# Patient Record
Sex: Male | Born: 2000 | Race: White | Hispanic: No | Marital: Single | Smoking: Never smoker
Health system: Southern US, Community
[De-identification: ages and names within clinical notes are randomized; demographics above are authoritative.]

## PROBLEM LIST (undated history)

## (undated) DIAGNOSIS — K5909 Other constipation: Secondary | ICD-10-CM

## (undated) DIAGNOSIS — G40909 Epilepsy, unspecified, not intractable, without status epilepticus: Secondary | ICD-10-CM

## (undated) DIAGNOSIS — Q9389 Other deletions from the autosomes: Secondary | ICD-10-CM

## (undated) DIAGNOSIS — F71 Moderate intellectual disabilities: Secondary | ICD-10-CM

## (undated) DIAGNOSIS — F840 Autistic disorder: Secondary | ICD-10-CM

## (undated) DIAGNOSIS — F6381 Intermittent explosive disorder: Secondary | ICD-10-CM

## (undated) DIAGNOSIS — G809 Cerebral palsy, unspecified: Secondary | ICD-10-CM

## (undated) HISTORY — PX: NO PAST SURGERIES: SHX2092

---
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
## 2018-01-19 ENCOUNTER — Ambulatory Visit (INDEPENDENT_AMBULATORY_CARE_PROVIDER_SITE_OTHER): Payer: Medicaid Other | Admitting: Neurology

## 2018-01-19 ENCOUNTER — Encounter: Payer: Self-pay | Admitting: Neurology

## 2018-01-19 VITALS — BP 120/76 | HR 86 | Ht 65.0 in | Wt 151.0 lb

## 2018-01-19 DIAGNOSIS — G40409 Other generalized epilepsy and epileptic syndromes, not intractable, without status epilepticus: Secondary | ICD-10-CM | POA: Diagnosis not present

## 2018-01-19 DIAGNOSIS — R296 Repeated falls: Secondary | ICD-10-CM
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
## 2018-01-19 NOTE — Patient Instructions (Addendum)
1. Have bloodwork for Lamotrigine level done early in the morning BEFORE he takes his morning dose of Lamotrigine. We will adjust dose depending on this level  Your provider has requested that you have LABS drawn in the FUTURE.  Our laboratory is located within [facility], [phone] of this building.  You do not need an appointment however, please come to [facility] prior to going to the lab - we will check you into the lab.     2. We will get an MRI brain with and without contrast under sedation scheduled 3. Schedule routine EEG 4. Keep a calendar of the trance episodes  5. Follow-up in 3-4 months, call for any changes

## 2018-02-04 ENCOUNTER — Other Ambulatory Visit: Payer: Medicaid Other

## 2018-02-04 DIAGNOSIS — G40409 Other generalized epilepsy and epileptic syndromes, not intractable, without status epilepticus: Secondary | ICD-10-CM

## 2018-02-04 DIAGNOSIS — R296 Repeated falls: Secondary | ICD-10-CM

## 2018-02-08 LAB — LAMOTRIGINE LEVEL: Lamotrigine Lvl: 9.1 ug/mL (ref 4.0–18.0)

## 2018-02-09 ENCOUNTER — Other Ambulatory Visit: Payer: Self-pay

## 2018-02-09 ENCOUNTER — Telehealth: Payer: Self-pay

## 2018-02-09 MED ORDER — LAMOTRIGINE 150 MG PO TABS: ORAL_TABLET | ORAL | 11 refills | Status: AC

## 2018-02-09 NOTE — Telephone Encounter (Signed)
Thanks! Let me talk to Darl Pikes first and see if she can try doing an EEG here. Hospital techs have not done EEG under sedation with MRI in the past and may be hard to coordinate.

## 2018-02-09 NOTE — Telephone Encounter (Signed)
LMOM relaying message below.    Dr. Karel Jarvis, I contacted scheduling at [facility] - pt is scheduled for MRI on Thursday June 6th

## 2018-02-09 NOTE — Telephone Encounter (Signed)
-----   Message from Van Clines, MD sent at 02/09/2018  9:56 AM EDT ----- Pls let his caregiver [name] know there is room to increase the Lamictal and see if he is having less spells on higher dose. He is currently on  BID, increase to  in AM, 450 mg in PM (he is taking  tabs, 2 in AM, 3 in PM). Also, can you pls f/u on MRI brain under sedation? When we get a date, I will ask the EEG techs in the hospital if they can potentially do an EEG at the same time. Thanks

## 2018-02-16 ENCOUNTER — Ambulatory Visit: Payer: Medicaid Other | Admitting: Neurology

## 2018-02-16 ENCOUNTER — Encounter

## 2018-02-24 ENCOUNTER — Ambulatory Visit (INDEPENDENT_AMBULATORY_CARE_PROVIDER_SITE_OTHER): Payer: Medicaid Other | Admitting: Neurology

## 2018-02-24 DIAGNOSIS — G40409 Other generalized epilepsy and epileptic syndromes, not intractable, without status epilepticus: Secondary | ICD-10-CM | POA: Diagnosis not present

## 2018-02-24 DIAGNOSIS — R296 Repeated falls: Secondary | ICD-10-CM

## 2018-03-02 ENCOUNTER — Other Ambulatory Visit: Payer: Self-pay

## 2018-03-02 ENCOUNTER — Encounter (HOSPITAL_COMMUNITY): Payer: Self-pay | Admitting: *Deleted

## 2018-03-02 NOTE — Pre-Procedure Instructions (Signed)
    [name]  03/02/2018      [facility] [place], [place] - [address] [place] [address] Phone: [phone] Fax: [phone]  [facility] [phone] - [place], [place] - [id number] [facility] [phone] [place] [id number] Phone: [phone] Fax: [phone]    Your procedure is scheduled on Thursday, March 03, 2018  [facility] at 5:30 A.M.  Call this number if you have problems the morning of surgery:  [phone]   Remember:  Do not eat food or drink liquids after midnight .  Take these medicines the morning of surgery with A SIP OF WATER: chlorproMAZINE (THORAZINE), hydrOXYzine (ATARAX/VISTARIL), lamoTRIgine (LAMICTAL), propranolol (INDERAL)   Stop taking vitamnins and herbal medications.   Do not wear jewelry, make-up or nail polish.  Do not wear lotions, powders, or perfumes, or deodorant.  Do not shave 48 hours prior to surgery.  Men may shave face and neck.  Do not bring valuables to the hospital.  [facility] is not responsible for any belongings or valuables.  Contacts, dentures or bridgework may not be worn into surgery.  For patients admitted to the hospital, discharge time will be determined by your treatment team. Patients discharged the day of surgery will not be allowed to drive home. Please read over the following fact sheets that you were given.

## 2018-03-02 NOTE — Progress Notes (Signed)
SDW-Pre-op call completed by Judie GrieveBryan, pt caregiver at [facility]. Caregiver denies that pt has a cardiac history. Caregiver denies that pt C/O SOB and chest pain. Caregiver denies that pt had a stress test, echo and cardiac cath " not since I have been taking care of him in the last two years." Caregiver denies that pt had an EKG and chest x ray within the last year. Caregiver denies recent labs. Caregiver made aware that pt must stop taking vitamins and herbal medications. Caregiver stated that pt can swallow medications whole without food. Caregiver verbalized understanding of all pre-op instructions.

## 2018-03-03 ENCOUNTER — Ambulatory Visit (HOSPITAL_COMMUNITY)
Admission: RE | Admit: 2018-03-03 | Discharge: 2018-03-03 | Disposition: A | Discharge status: Home / Self Care | Payer: Medicaid Other | Source: Ambulatory Visit | Attending: Neurology | Admitting: Neurology

## 2018-03-03 ENCOUNTER — Encounter (HOSPITAL_COMMUNITY): Payer: Self-pay

## 2018-03-03 ENCOUNTER — Ambulatory Visit (HOSPITAL_COMMUNITY): Payer: Medicaid Other | Admitting: Certified Registered Nurse Anesthetist

## 2018-03-03 ENCOUNTER — Ambulatory Visit (HOSPITAL_COMMUNITY): Payer: Medicaid Other

## 2018-03-03 ENCOUNTER — Encounter (HOSPITAL_COMMUNITY): Admission: RE | Disposition: A | Discharge status: Home / Self Care | Payer: Self-pay | Source: Ambulatory Visit

## 2018-03-03 DIAGNOSIS — F840 Autistic disorder: Secondary | ICD-10-CM | POA: Insufficient documentation

## 2018-03-03 DIAGNOSIS — R0902 Hypoxemia: Secondary | ICD-10-CM

## 2018-03-03 DIAGNOSIS — G40909 Epilepsy, unspecified, not intractable, without status epilepticus: Secondary | ICD-10-CM | POA: Diagnosis present

## 2018-03-03 DIAGNOSIS — Q9389 Other deletions from the autosomes: Secondary | ICD-10-CM | POA: Insufficient documentation

## 2018-03-03 DIAGNOSIS — R6250 Unspecified lack of expected normal physiological development in childhood: Secondary | ICD-10-CM | POA: Insufficient documentation

## 2018-03-03 DIAGNOSIS — R296 Repeated falls: Secondary | ICD-10-CM

## 2018-03-03 DIAGNOSIS — G40409 Other generalized epilepsy and epileptic syndromes, not intractable, without status epilepticus: Secondary | ICD-10-CM

## 2018-03-03 DIAGNOSIS — R0602 Shortness of breath: Secondary | ICD-10-CM

## 2018-03-03 DIAGNOSIS — F6381 Intermittent explosive disorder: Secondary | ICD-10-CM | POA: Diagnosis not present

## 2018-03-03 HISTORY — DX: Cerebral palsy, unspecified: G80.9

## 2018-03-03 HISTORY — DX: Moderate intellectual disabilities: F71

## 2018-03-03 HISTORY — PX: RADIOLOGY WITH ANESTHESIA: SHX6223

## 2018-03-03 HISTORY — DX: Other constipation: K59.09

## 2018-03-03 HISTORY — DX: Autistic disorder: F84.0

## 2018-03-03 LAB — CBC
HCT: 38.5 % — ABNORMAL LOW (ref 39.0–52.0)
Hemoglobin: 12.3 g/dL — ABNORMAL LOW (ref 13.0–17.0)
MCH: 27.4 pg (ref 26.0–34.0)
MCHC: 31.9 g/dL (ref 30.0–36.0)
MCV: 85.7 fL (ref 78.0–100.0)
Platelets: 235 10*3/uL (ref 150–400)
RBC: 4.49 MIL/uL (ref 4.22–5.81)
RDW: 13.5 % (ref 11.5–15.5)
WBC: 10.2 10*3/uL (ref 4.0–10.5)

## 2018-03-03 LAB — BASIC METABOLIC PANEL
Anion gap: 11 (ref 5–15)
BUN: 11 mg/dL (ref 6–20)
CALCIUM: 9.4 mg/dL (ref 8.9–10.3)
CO2: 23 mmol/L (ref 22–32)
CREATININE: 0.7 mg/dL (ref 0.61–1.24)
Chloride: 107 mmol/L (ref 101–111)
GFR calc non Af Amer: >60 mL/min (ref >60)
Glucose, Bld: 76 mg/dL (ref 65–99)
Potassium: 4.6 mmol/L (ref 3.5–5.1)
SODIUM: 141 mmol/L (ref 135–145)

## 2018-03-03 SURGERY — MRI WITH ANESTHESIA
Anesthesia: General

## 2018-03-03 MED ORDER — FENTANYL CITRATE (PF) 100 MCG/2ML IJ SOLN: 25.0000 ug | INTRAMUSCULAR | Status: DC | PRN

## 2018-03-03 MED ORDER — MEPERIDINE HCL 50 MG/ML IJ SOLN: 6.2500 mg | INTRAMUSCULAR | Status: DC | PRN

## 2018-03-03 MED ORDER — GADOBENATE DIMEGLUMINE 529 MG/ML IV SOLN
15.0000 mL | Freq: Once | INTRAVENOUS | Status: AC
  Administered 2018-03-03: 15 mL via INTRAVENOUS

## 2018-03-03 MED ORDER — PROMETHAZINE HCL 25 MG/ML IJ SOLN: 6.2500 mg | INTRAMUSCULAR | Status: DC | PRN

## 2018-03-03 NOTE — Transfer of Care (Signed)
Immediate Anesthesia Transfer of Care Note  Patient: Kevin Strickland  Procedure(s) Performed: MRI OF THE BRAIN WITH AND WITHOUT CONTRAST (N/A )  Patient Location: PACU  Anesthesia Type:General  Level of Consciousness: awake, alert  and patient cooperative  Airway & Oxygen Therapy: Patient Spontanous Breathing  Post-op Assessment: Report given to RN and Post -op Vital signs reviewed and stable  Post vital signs: Reviewed  Last Vitals:  Vitals Value Taken Time  BP    Temp    Pulse    Resp    SpO2      Last Pain:  Vitals:   03/03/18 1110  TempSrc:   PainSc: 0-No pain      Patients Stated Pain Goal: 0 (03/03/18 [id number])  Complications: No apparent anesthesia complications

## 2018-03-03 NOTE — Addendum Note (Signed)
Addendum  created 03/03/18 1242 by Rosalio MacadamiaHayes, Cherlyn Syring T, CRNA   Charge Capture section accepted

## 2018-03-03 NOTE — Addendum Note (Signed)
Addendum  created 03/03/18 1325 by Lewie LoronGermeroth, Joandy Burget, MD   Sign clinical note

## 2018-03-03 NOTE — Anesthesia Postprocedure Evaluation (Signed)
Anesthesia Post Note  Patient: Kevin Strickland  Procedure(s) Performed: MRI OF THE BRAIN WITH AND WITHOUT CONTRAST (N/A )     Patient location during evaluation: PACU Anesthesia Type: General Level of consciousness: sedated and patient cooperative Pain management: pain level controlled Vital Signs Assessment: post-procedure vital signs reviewed and stable Respiratory status: spontaneous breathing Cardiovascular status: stable Anesthetic complications: no    Last Vitals:  Vitals:   03/03/18 1110 03/03/18 1125  BP: (!) 113/99 118/87  Pulse: 88 86  Resp: 16 (!) 8  Temp:    SpO2: 91% (!) 87%    Last Pain:  Vitals:   03/03/18 1110  TempSrc:   PainSc: 0-No pain                 Lewie LoronJohn Jerrie Gullo

## 2018-03-03 NOTE — Anesthesia Preprocedure Evaluation (Addendum)
Anesthesia Evaluation  Patient identified by MRN, date of birth, ID band Patient awake    Reviewed: Allergy & Precautions, NPO status , Patient's Chart, lab work & pertinent test results  Airway Mallampati: I  TM Distance: >3 FB Neck ROM: Full    Dental no notable dental hx.    Pulmonary neg pulmonary ROS,    Pulmonary exam normal breath sounds clear to auscultation       Cardiovascular negative cardio ROS Normal cardiovascular exam Rhythm:Regular Rate:Normal     Neuro/Psych negative neurological ROS  negative psych ROS   GI/Hepatic negative GI ROS, Neg liver ROS,   Endo/Other  negative endocrine ROS  Renal/GU negative Renal ROS     Musculoskeletal negative musculoskeletal ROS (+)   Abdominal   Peds  Hematology negative hematology ROS (+)   Anesthesia Other Findings   Reproductive/Obstetrics                             Anesthesia Physical Anesthesia Plan  ASA: II  Anesthesia Plan: General   Post-op Pain Management:    Induction: Intravenous  PONV Risk Score and Plan: 2 and Ondansetron and Dexamethasone  Airway Management Planned: Oral ETT  Additional Equipment:   Intra-op Plan:   Post-operative Plan: Extubation in OR  Informed Consent: I have reviewed the patients History and Physical, chart, labs and discussed the procedure including the risks, benefits and alternatives for the proposed anesthesia with the patient or authorized representative who has indicated his/her understanding and acceptance.   Dental advisory given  Plan Discussed with: CRNA  Anesthesia Plan Comments:        Anesthesia Quick Evaluation

## 2018-03-03 NOTE — OR Nursing (Signed)
Pt ambulated to restroom with caregiver wtihout difficulty.  Pt remains off O2.  Awaiting chest xray at this time

## 2018-03-03 NOTE — H&P (Signed)
Anesthesia H&P Update: History and Physical Exam reviewed; patient is OK for planned anesthetic and procedure. ? ?

## 2018-03-04 ENCOUNTER — Encounter (HOSPITAL_COMMUNITY): Payer: Self-pay | Admitting: Radiology

## 2018-03-04 MED FILL — Glycopyrrolate IV Soln Prefilled Syr 0.6 MG/3ML (0.2 MG/ML): INTRAVENOUS | Qty: 3 | Status: AC

## 2018-03-04 MED FILL — Neostigmine Methylsulfate Soln Pref Syr 5 MG/5ML (1 MG/ML): INTRAVENOUS | Qty: 5 | Status: AC

## 2018-03-04 MED FILL — Phenylephrine-NaCl Pref Syr 0.4 MG/10ML-0.9% (40 MCG/ML): INTRAVENOUS | Qty: 10 | Status: AC

## 2018-03-04 MED FILL — Lidocaine HCl Local Soln Prefilled Syringe 100 MG/5ML (2%): INTRAMUSCULAR | Qty: 5 | Status: AC

## 2018-03-04 MED FILL — Rocuronium Bromide IV Soln 50 MG/5ML (10 MG/ML): INTRAVENOUS | Qty: 5 | Status: AC

## 2018-03-04 MED FILL — Propofol IV Emul 200 MG/20ML (10 MG/ML): INTRAVENOUS | Qty: 20 | Status: AC

## 2018-03-04 MED FILL — Ondansetron HCl Inj 4 MG/2ML (2 MG/ML): INTRAMUSCULAR | Qty: 2 | Status: AC

## 2018-03-04 MED FILL — Midazolam HCl Inj 2 MG/2ML (Base Equivalent): INTRAMUSCULAR | Qty: 2 | Status: AC

## 2018-03-04 MED FILL — Lactated Ringer's Solution: INTRAVENOUS | Qty: 1000 | Status: AC

## 2018-03-07 NOTE — H&P (Deleted)
Anesthesia H&P Update: History and Physical Exam reviewed; patient is OK for planned anesthetic and procedure. ? ?

## 2018-03-17 NOTE — Procedures (Signed)
ELECTROENCEPHALOGRAM REPORT  Date of Study: 02/24/2018  Patient's Name: Kevin Strickland MRN: [id number] Date of Birth: 11/06/2000  Referring Provider: Dr. 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 Mother is concerned about continued seizures where he "goes in a trance" followed by aggression.  Medications: Lamictal Thorazine Vistaril Inderal Desyrel  Technical Summary: A multichannel digital EEG recording measured by the international 10-20 system with electrodes applied with paste and impedances below 5000 ohms performed in our laboratory with EKG monitoring in an awake patient.  Hyperventilation and photic stimulation were not performed.  The digital EEG was referentially recorded, reformatted, and digitally filtered in a variety of bipolar and referential montages for optimal display.    Description: The patient is awake during the recording. There is no clear posterior dominant rhythm seen. The background shows diffuse low voltage activity with no focal slowing seen. Sleep was not captured. Hyperventilation and photic stimulation were not performed.  There were no epileptiform discharges or electrographic seizures seen.  There was EKG artifact throughout the study.  EKG lead was unremarkable.  Impression: This awake EEG showed diffuse low voltage activity which is nonspecific in etiology, it may be a normal variant (seen with advancing age, nervousness, anxiety) in the absence of extracerebral pathology (scalp edema, subdural hematoma). It can also be seen with degenerative or metabolic conditions. There were no focal abnormalities seen in this study.   Patrcia DollyKaren Aquino, M.D.

## 2018-03-23 ENCOUNTER — Encounter (HOSPITAL_COMMUNITY): Payer: Self-pay

## 2018-03-23 ENCOUNTER — Ambulatory Visit (HOSPITAL_COMMUNITY)
Admission: EM | Admit: 2018-03-23 | Discharge: 2018-03-23 | Disposition: A | Discharge status: Home / Self Care | Payer: Medicaid Other | Attending: Family Medicine | Admitting: Family Medicine

## 2018-03-23 DIAGNOSIS — S80211A Abrasion, right knee, initial encounter: Secondary | ICD-10-CM

## 2018-03-23 DIAGNOSIS — L739 Follicular disorder, unspecified: Secondary | ICD-10-CM

## 2018-03-23 MED ORDER — DOXYCYCLINE HYCLATE 100 MG PO CAPS: 100.0000 mg | ORAL_CAPSULE | Freq: Two times a day (BID) | ORAL | 0 refills | Status: AC

## 2018-03-23 MED ORDER — SELENIUM SULFIDE 2.25 % EX SHAM: 1.0000 "application " | MEDICATED_SHAMPOO | CUTANEOUS | 0 refills | Status: AC

## 2018-03-23 MED ORDER — MUPIROCIN 2 % EX OINT: 1.0000 "application " | TOPICAL_OINTMENT | Freq: Two times a day (BID) | CUTANEOUS | 0 refills | Status: AC

## 2018-03-23 NOTE — ED Provider Notes (Signed)
[facility]    CSN: [id number] Arrival date & time: 03/23/18  1940     History   Chief Complaint No chief complaint on file.   HPI Kevin Strickland is a 17 y.o. male.   17 year old male with history of cerebral palsy, autism, seizure disorder comes in with caregiver for multiple complaints.    Right knee abrasion after fall today. Caregiver states patient was chasing after someone when he tripped and fell.  He was able to ambulate after the incident without problems.  Has been walking without problems.  Has not taken anything for the symptoms.  Few day history of rash to the scalp.  Caregiver states this is a recurrent problem, usually treated by doxycycline.  Denies any changes in hygiene products.  Patient has been scratching scalp, no obvious pain.  Denies fever, chills, night sweats.  Usually uses Hibiclens to clean the scalp.      Past Medical History:  Diagnosis Date  . Autism spectrum disorder   . Chronic constipation   . CP (cerebral palsy) (HCC)   . 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 Intermittent explosive disorder   . Moderate intellectual disability   . Seizure disorder [facility])     Patient Active Problem List   Diagnosis Date Noted  . Adjustment disorder 09/16/2016  . Aggressive behavior     Past Surgical History:  Procedure Laterality Date  . NO PAST SURGERIES    . RADIOLOGY WITH ANESTHESIA N/A 03/03/2018   Procedure: MRI OF THE BRAIN WITH AND WITHOUT CONTRAST;  Surgeon: Radiologist, Medication, MD;  Location: [facility] OR;  Service: Radiology;  Laterality: N/A;       Home Medications    Prior to Admission medications   Medication Sig Start Date End Date Taking? Authorizing Provider  chlorhexidine (HIBICLENS) 4 % external liquid Apply 60 mLs topically 2 (two) times daily.     [provider]  chlorproMAZINE (THORAZINE) 10 MG tablet Take 10 mg by mouth 3 (three) times daily.    [provider]    chlorproMAZINE (THORAZINE) 50 MG tablet Take 50 mg by mouth daily as needed (agitated behavior).     [provider]  doxycycline (VIBRAMYCIN) 100 MG capsule Take 1 capsule (100 mg total) by mouth 2 (two) times daily. 03/23/18   Cathie Hoops, Yenni Carra V, PA-C  hydrOXYzine (ATARAX/VISTARIL) 25 MG tablet Take 25 mg by mouth 4 (four) times daily.     [provider]  lamoTRIgine (LAMICTAL) 150 MG tablet Take 2 tablets in AM and 3 tablets in PM 02/09/18   Van Clines, MD  mupirocin ointment (BACTROBAN) 2 % Apply 1 application topically 2 (two) times daily. 03/23/18   Cathie Hoops, Marchel Foote V, PA-C  neomycin-bacitracin-polymyxin (NEOSPORIN) ointment Apply 1 application topically daily. To scalp    [provider]  propranolol (INDERAL) 40 MG tablet Take 40 mg by mouth 2 (two) times daily.    [provider]  Selenium Sulfide 2.25 % SHAM Apply 1 application topically once a week. 03/23/18   Cathie Hoops, Daevon Holdren V, PA-C  traZODone (DESYREL) 50 MG tablet Take 50 mg by mouth at bedtime.    [provider]    Family History Family History  Problem Relation Age of Onset  . Other Brother        genetic d/o  . Autism Brother     Social History Social History   Tobacco Use  . Smoking status: Never Smoker  . Smokeless tobacco: Never  Used  Substance Use Topics  . Alcohol use: No  . Drug use: Never     Allergies   Depakote [divalproex sodium]   Review of Systems Review of Systems  Reason unable to perform ROS: See HPI as above.     Physical Exam Triage Vital Signs ED Triage Vitals [03/23/18 1954]  Enc Vitals Group     BP 126/75     Pulse Rate 86     Resp 18     Temp      Temp src      SpO2 100 %     Weight      Height      Head Circumference      Peak Flow      Pain Score 4     Pain Loc      Pain Edu?      Excl. in GC?    No data found.  Updated Vital Signs BP 126/75   Pulse 86   Resp 18   SpO2 100%   Physical Exam  Constitutional: He is oriented to person,  place, and time. He appears well-developed and well-nourished. No distress.  HENT:  Head: Normocephalic and atraumatic.  Eyes: Pupils are equal, round, and reactive to light. Conjunctivae are normal.  Musculoskeletal:  Abrasion to the right knee without swelling, erythema, increased warmth.  No contusion seen.  Full range of motion of the knee.  Strength normal and equal bilaterally.  Unable to assess sensation as patient is nonverbal.  Neurological: He is alert and oriented to person, place, and time.  Skin:  See picture below.  Diffuse erythema of the scalp with folliculitis.        UC Treatments / Results  Labs (all labs ordered are listed, but only abnormal results are displayed) Labs Reviewed - No data to display  EKG None  Radiology No results found.  Procedures Procedures (including critical care time)  Medications Ordered in UC Medications - No data to display  Initial Impression / Assessment and Plan / UC Course  I have reviewed the triage vital signs and the nursing notes.  Pertinent labs & imaging results that were available during my care of the patient were reviewed by me and considered in my medical decision making (see chart for details).    Bactroban ointment for abrasion.  Wound care instructions given.  Return precautions given.  Given patient with good improvement on doxycycline the past for folliculitis of the scalp, start doxycycline as directed.  Can try selenium sulfide to see if it can prevent frequent recurrence.  Return precautions given.  Caregiver expresses understanding and agrees to plan.  Final Clinical Impressions(s) / UC Diagnoses   Final diagnoses:  Abrasion of right knee, initial encounter  Folliculitis    ED Prescriptions    Medication Sig Dispense Auth. Provider   Selenium Sulfide 2.25 % SHAM Apply 1 application topically once a week. 180 mL Jaquelyne Firkus V, PA-C   doxycycline (VIBRAMYCIN) 100 MG capsule Take 1 capsule (100 mg total)  by mouth 2 (two) times daily. 20 capsule Cathie HoopsYu, Ritchard Paragas V, PA-C   mupirocin ointment (BACTROBAN) 2 % Apply 1 application topically 2 (two) times daily. 22 g Threasa AlphaYu, Earnestine Tuohey V, PA-C        Martie Fulgham V, [place] JerseyPA-C 03/23/18 2013

## 2018-03-23 NOTE — Discharge Instructions (Signed)
Start Bactroban ointment on the and the abrasion.  Keep area clean and dry, he can clean it with soap and water gently.  Monitor for spreading redness, increased warmth, fever, follow-up for reevaluation.  Start Doxycycline for folliculitis of the scalp.  He can try selenium sulfide  once every 1 to 2 weeks to try to prevent recurrence.  Follow-up with PCP for further evaluation if symptoms recur.

## 2018-03-23 NOTE — ED Triage Notes (Signed)
Pt has abrasion to right knee from fall on to cement.  And also rash that is recurrent on his scalp.

## 2018-03-29 NOTE — H&P (Signed)
HPI: This is a 17 year old man with a history of chromosome 1p36 deletion, autism, developmental delay, and seizures, evaluated in the Neurology clinic on 01/19/18 for concern for continued seizures. History obtained from caregiver and his mother. Witnesses report episodes where he "goes into a trance" like he is in another world, eyes are blank, rolling his tongue. [name] states he does not become unresponsive. Before and after these episodes, his walking would change, walking toward the right or left side (more toward the right, per [place]), then he would go into "attack mode."  With the falls, he would be noted to be leaning to the right. Head CT without contrast did not show any acute changes. There was note of prominent lateral and third ventricles with normal size fourth ventricle, concerning for pathology at the level of the aqueduct. No definite mass but study is limited by motion. MRI may help for further correlation.   PAST MEDICAL HISTORY:     Past Medical History:  Diagnosis Date  . 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 Intermittent explosive disorder   . Seizure disorder (HCC)     PAST SURGICAL HISTORY:      Past Surgical History:  Procedure Laterality Date  . NO PAST SURGERIES      MEDICATIONS:       Current Outpatient Medications on File Prior to Visit  Medication Sig Dispense Refill  . chlorhexidine (HIBICLENS) 4 % external liquid Apply topically daily as needed.    . chlorproMAZINE (THORAZINE) 10 MG tablet Take 10 mg by mouth 3 (three) times daily.    . hydrOXYzine (ATARAX/VISTARIL) 25 MG tablet Take 25 mg by mouth every 6 (six) hours.     Marland Kitchen lamoTRIgine (LAMICTAL) 150 MG tablet Take 300 mg by mouth 2 (two) times daily.     . propranolol (INDERAL) 40 MG tablet Take 40 mg by mouth 2 (two) times daily.    . traZODone (DESYREL) 50 MG tablet Take 50 mg by mouth at bedtime.     No current facility-administered medications on file  prior to visit.     ALLERGIES:      Allergies  Allergen Reactions  . Depakote [Divalproex Sodium] Other (See Comments)    As noted by verbal MAR (reaction?)    FAMILY HISTORY:      Family History  Problem Relation Age of Onset  . Other Brother        genetic d/o  . Autism Brother     SOCIAL HISTORY: Social History        Socioeconomic History  . Marital status: Single    Spouse name: Not on file  . Number of children: Not on file  . Years of education: Not on file  . Highest education level: Not on file  Occupational History  . Not on file  Social Needs  . Financial resource strain: Not on file  . Food insecurity:    Worry: Not on file    Inability: Not on file  . Transportation needs:    Medical: Not on file    Non-medical: Not on file  Tobacco Use  . Smoking status: Never Smoker  . Smokeless tobacco: Never Used  Substance and Sexual Activity  . Alcohol use: No  . Drug use: Never  . Sexual activity: Not on file  Lifestyle  . Physical activity:    Days per week: Not on file    Minutes per session: Not on file  . Stress:  Not on file  Relationships  . Social connections:    Talks on phone: Not on file    Gets together: Not on file    Attends religious service: Not on file    Active member of club or organization: Not on file    Attends meetings of clubs or organizations: Not on file    Relationship status: Not on file  . Intimate partner violence:    Fear of current or ex partner: Not on file    Emotionally abused: Not on file    Physically abused: Not on file    Forced sexual activity: Not on file  Other Topics Concern  . Not on file  Social History Narrative  . Not on file    REVIEW OF SYSTEMS unable to obtain due to developmental delay  PHYSICAL EXAM from Neurology clinic visit 01/19/18:    Vitals:   01/19/18 1302  BP: 120/76  Pulse: 86  SpO2: 96%   General: No acute distress, he can  say 1-2 words and uses sign language, follows simple commands.  Head:  He has typical facies of patients with 1p36 deletion syndrome with deep-set eyes, straight eyebrows, microbrachycephaly, sunken appearance of middle of face Neck: supple, no paraspinal tenderness, full range of motion Back: No paraspinal tenderness Heart: regular rate and rhythm Lungs: Clear to auscultation bilaterally. Vascular: No carotid bruits. Skin/Extremities: No rash, no edema Neurological Exam: Mental status: alert, minimal verbal output, says he is fine and does sign language, follows simple commands Cranial nerves: CN I: not tested CN II: pupils equal, round and reactive to light, blink to threat bilaterally. Unable to tolerate fundosopy CN III, IV, VI:  full range of motion, no nystagmus, no ptosis CN V: unable to test CN VII: upper and lower face symmetric CN VIII: hearing intact to conversation CN XI: sternocleidomastoid and trapezius muscles intact CN XII: tongue midline Bulk & Tone: normal, no fasciculations. Motor: unable to do formal muscle testing, moves all extremities symmetrically Sensation: withdraws to touch Deep Tendon Reflexes: unable to perform (patient reported to get agitated) Plantar responses: unable to perform Cerebellar: no incoordination reaching for objects Gait: hunched posture with arms flexed at elbows while walking, no ataxia, does not list to either side Tremor: none  IMPRESSION:  PAST MEDICAL HISTORY:     Past Medical History:  Diagnosis Date  . 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 Intermittent explosive disorder   . Seizure disorder (HCC)     PAST SURGICAL HISTORY:      Past Surgical History:  Procedure Laterality Date  . NO PAST SURGERIES      MEDICATIONS:       Current Outpatient Medications on File Prior to Visit  Medication Sig Dispense Refill  . chlorhexidine (HIBICLENS) 4 % external liquid Apply topically daily as  needed.    . chlorproMAZINE (THORAZINE) 10 MG tablet Take 10 mg by mouth 3 (three) times daily.    . hydrOXYzine (ATARAX/VISTARIL) 25 MG tablet Take 25 mg by mouth every 6 (six) hours.     Marland Kitchen lamoTRIgine (LAMICTAL) 150 MG tablet Take 300 mg by mouth 2 (two) times daily.     . propranolol (INDERAL) 40 MG tablet Take 40 mg by mouth 2 (two) times daily.    . traZODone (DESYREL) 50 MG tablet Take 50 mg by mouth at bedtime.     No current facility-administered medications on file prior to visit.     ALLERGIES:  Allergies  Allergen Reactions  . Depakote [Divalproex Sodium] Other (See Comments)    As noted by verbal MAR (reaction?)    FAMILY HISTORY:      Family History  Problem Relation Age of Onset  . Other Brother        genetic d/o  . Autism Brother     SOCIAL HISTORY: Social History        Socioeconomic History  . Marital status: Single    Spouse name: Not on file  . Number of children: Not on file  . Years of education: Not on file  . Highest education level: Not on file  Occupational History  . Not on file  Social Needs  . Financial resource strain: Not on file  . Food insecurity:    Worry: Not on file    Inability: Not on file  . Transportation needs:    Medical: Not on file    Non-medical: Not on file  Tobacco Use  . Smoking status: Never Smoker  . Smokeless tobacco: Never Used  Substance and Sexual Activity  . Alcohol use: No  . Drug use: Never  . Sexual activity: Not on file  Lifestyle  . Physical activity:    Days per week: Not on file    Minutes per session: Not on file  . Stress: Not on file  Relationships  . Social connections:    Talks on phone: Not on file    Gets together: Not on file    Attends religious service: Not on file    Active member of club or organization: Not on file    Attends meetings of clubs or organizations: Not on file    Relationship status: Not on file  . Intimate  partner violence:    Fear of current or ex partner: Not on file    Emotionally abused: Not on file    Physically abused: Not on file    Forced sexual activity: Not on file  Other Topics Concern  . Not on file  Social History Narrative  . Not on file    REVIEW OF SYSTEMS unable to obtain due to developmental delay  PHYSICAL EXAM:    Vitals:   01/19/18 1302  BP: 120/76  Pulse: 86  SpO2: 96%   General: No acute distress, he can say 1-2 words and uses sign language, follows simple commands.  Head:  He has typical facies of patients with 1p36 deletion syndrome with deep-set eyes, straight eyebrows, microbrachycephaly, sunken appearance of middle of face Neck: supple, no paraspinal tenderness, full range of motion Back: No paraspinal tenderness Heart: regular rate and rhythm Lungs: Clear to auscultation bilaterally. Vascular: No carotid bruits. Skin/Extremities: No rash, no edema Neurological Exam: Mental status: alert, minimal verbal output, says he is fine and does sign language, follows simple commands Cranial nerves: CN I: not tested CN II: pupils equal, round and reactive to light, blink to threat bilaterally. Unable to tolerate fundosopy CN III, IV, VI:  full range of motion, no nystagmus, no ptosis CN V: unable to test CN VII: upper and lower face symmetric CN VIII: hearing intact to conversation CN XI: sternocleidomastoid and trapezius muscles intact CN XII: tongue midline Bulk & Tone: normal, no fasciculations. Motor: unable to do formal muscle testing, moves all extremities symmetrically Sensation: withdraws to touch Deep Tendon Reflexes: unable to perform (patient reported to get agitated) Plantar responses: unable to perform Cerebellar: no incoordination reaching for objects Gait: hunched posture with arms flexed at elbows  while walking, no ataxia, does not list to either side Tremor: none  IMPRESSION:  This is a 17 year old man with history of  chromosome 1p36 deletion with developmental delay and childhood seizures, behavioral issues, with frequent falls and gait changes. With this disorder, patients have limited speech, seizures, as well as behavioral issues. His mother and group home staff express concern about continued seizures where he goes in a trance then becomes very aggressive ("attack mode"), associated with leaning to one side (mostly right) before and after the episodes. 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

## 2018-04-06 ENCOUNTER — Telehealth: Payer: Self-pay | Admitting: Neurology

## 2018-04-06 NOTE — Telephone Encounter (Signed)
Patient care taker Kevin Strickland stating he never received results from MRI on 6.6.19. Kevin Strickland requesting a call with results and to discuss next steps.

## 2018-04-07 NOTE — Telephone Encounter (Signed)
Pls let Kevin Strickland know that the brain wave test was normal. 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 We had increased the Lamictal dose, has there been a reduction in the episodes?

## 2018-04-07 NOTE — Telephone Encounter (Signed)
Spoke with pt's care giver, Judie GrieveBryan, relaying message below.  Judie GrieveBryan asked that reports of EEG and MRI be faxed to pt's group home to be placed in his medical records there.  Fax number [phone] ATTN: Belva AgeePeggy Baker.

## 2018-06-20 ENCOUNTER — Ambulatory Visit (HOSPITAL_COMMUNITY)
Admission: EM | Admit: 2018-06-20 | Discharge: 2018-06-20 | Disposition: A | Discharge status: Home / Self Care | Payer: Medicaid Other | Attending: Family Medicine | Admitting: Family Medicine

## 2018-06-20 ENCOUNTER — Telehealth: Payer: Self-pay | Admitting: Neurology

## 2018-06-20 ENCOUNTER — Encounter (HOSPITAL_COMMUNITY): Payer: Self-pay | Admitting: Emergency Medicine

## 2018-06-20 DIAGNOSIS — G809 Cerebral palsy, unspecified: Secondary | ICD-10-CM | POA: Diagnosis not present

## 2018-06-20 DIAGNOSIS — R42 Dizziness and giddiness: Secondary | ICD-10-CM | POA: Diagnosis present

## 2018-06-20 DIAGNOSIS — R8290 Unspecified abnormal findings in urine: Secondary | ICD-10-CM | POA: Diagnosis not present

## 2018-06-20 DIAGNOSIS — R2681 Unsteadiness on feet: Secondary | ICD-10-CM | POA: Diagnosis not present

## 2018-06-20 DIAGNOSIS — Z79899 Other long term (current) drug therapy: Secondary | ICD-10-CM | POA: Insufficient documentation

## 2018-06-20 DIAGNOSIS — R3989 Other symptoms and signs involving the genitourinary system: Secondary | ICD-10-CM

## 2018-06-20 LAB — POCT URINALYSIS DIP (DEVICE)
BILIRUBIN URINE: NEGATIVE
Glucose, UA: NEGATIVE mg/dL
KETONES UR: NEGATIVE mg/dL
NITRITE: NEGATIVE
Protein, ur: NEGATIVE mg/dL
Specific Gravity, Urine: 1.015 (ref 1.005–1.030)
UROBILINOGEN UA: 2 mg/dL — AB (ref 0.0–1.0)
pH: 7.5 (ref 5.0–8.0)

## 2018-06-20 NOTE — ED Triage Notes (Signed)
Per caretaker, pt c/o dizziness. Pt hx of seizures, has not had any seizures lately.

## 2018-06-20 NOTE — Discharge Instructions (Addendum)
It was nice seeing Kevin Strickland today. I am glad he feels better with his symptoms. Orthostatic vitals are fine. Dizziness or loss of balance may be due to some of his medications which includes Propranolol, Lamictal and Atarax. Please review and adjust medication with his PCP soon. It could also be due to vertigo. Will not give antivertigo meds since he is already on Atarax.If symptoms reoccurs, please take him to the ED for CT of his head. Implement fall precaution. F/U with us as needed.  Note: Urine looks fine except for small leukocyte, we will send for culture and call with result if positive. Keep well hydrated.

## 2018-06-20 NOTE — ED Provider Notes (Signed)
[facility]    CSN: [id number] Arrival date & time: 06/20/18  1902     History   Chief Complaint Chief Complaint  Patient presents with  . Dizziness    HPI Kevin Strickland is a 17 y.o. male.   The history is provided by a caregiver (Nonverbal, he lives in a group home).  Dizziness  Quality:  Imbalance (He is non-verbal. His caretaker stated that he had unsteady gait this morning from 9 AM till about 4 PM each time he tries to get up. He fell x 1 without head injury or LOC) Severity:  Mild Onset quality:  Gradual Timing:  Intermittent Progression:  Resolved Chronicity: He had similar episode in the past and was attributed to his propranolol which was decreased from TID to BID. caretaker cannot tell why he is on Propranolol. Context: physical activity   Context: not with eye movement and not with loss of consciousness   Relieved by:  Nothing Worsened by:  Nothing Ineffective treatments:  None tried Associated symptoms: no blood in stool, no diarrhea, no nausea, no palpitations, no shortness of breath, no syncope and no vomiting   Associated symptoms comment:  BP at the time was 90/69 Neurologist's office was consulted and a f/u appointment was scheduled for Oct. He sees them for his seizure. No seizure episode this time.  Change in urine color for the last few days.  Past Medical History:  Diagnosis Date  . Autism spectrum disorder   . Chronic constipation   . CP (cerebral palsy) (HCC)   . 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 Intermittent explosive disorder   . Moderate intellectual disability   . Seizure disorder [facility])     Patient Active Problem List   Diagnosis Date Noted  . Adjustment disorder 09/16/2016  . Aggressive behavior     Past Surgical History:  Procedure Laterality Date  . NO PAST SURGERIES    . RADIOLOGY WITH ANESTHESIA N/A 03/03/2018   Procedure: MRI OF THE BRAIN WITH AND WITHOUT CONTRAST;  Surgeon: Radiologist,  Medication, MD;  Location: [facility] OR;  Service: Radiology;  Laterality: N/A;       Home Medications    Prior to Admission medications   Medication Sig Start Date End Date Taking? Authorizing Provider  chlorhexidine (HIBICLENS) 4 % external liquid Apply 60 mLs topically 2 (two) times daily.     [provider]  chlorproMAZINE (THORAZINE) 10 MG tablet Take 10 mg by mouth 3 (three) times daily.    [provider]  chlorproMAZINE (THORAZINE) 50 MG tablet Take 50 mg by mouth daily as needed (agitated behavior).     [provider]  doxycycline (VIBRAMYCIN) 100 MG capsule Take 1 capsule (100 mg total) by mouth 2 (two) times daily. Patient not taking: Reported on 06/20/2018 03/23/18   Belinda Fisher, PA-C  hydrOXYzine (ATARAX/VISTARIL) 25 MG tablet Take 25 mg by mouth 4 (four) times daily.     [provider]  lamoTRIgine (LAMICTAL) 150 MG tablet Take 2 tablets in AM and 3 tablets in PM 02/09/18   Van Clines, MD  mupirocin ointment (BACTROBAN) 2 % Apply 1 application topically 2 (two) times daily. 03/23/18   Cathie Hoops, Amy V, PA-C  neomycin-bacitracin-polymyxin (NEOSPORIN) ointment Apply 1 application topically daily. To scalp    [provider]  propranolol (INDERAL) 40 MG tablet Take 40 mg by mouth 2 (two) times daily.    [provider]  Selenium Sulfide 2.25 %  SHAM Apply 1 application topically once a week. 03/23/18   Cathie Hoops, Amy V, PA-C  traZODone (DESYREL) 50 MG tablet Take 50 mg by mouth at bedtime.    [provider]    Family History Family History  Problem Relation Age of Onset  . Other Brother        genetic d/o  . Autism Brother     Social History Social History   Tobacco Use  . Smoking status: Never Smoker  . Smokeless tobacco: Never Used  Substance Use Topics  . Alcohol use: No  . Drug use: Never     Allergies   Depakote [divalproex sodium]   Review of Systems Review of Systems  Respiratory: Negative for shortness of  breath.   Cardiovascular: Negative for palpitations and syncope.  Gastrointestinal: Negative for blood in stool, diarrhea, nausea and vomiting.  Genitourinary: Negative.   Neurological: Positive for dizziness.  All other systems reviewed and are negative.    Physical Exam Triage Vital Signs ED Triage Vitals  Enc Vitals Group     BP 06/20/18 1939 120/76     Pulse Rate 06/20/18 1939 (!) 102     Resp 06/20/18 1939 18     Temp 06/20/18 1939 99 F (37.2 C)     Temp Source 06/20/18 1939 Temporal     SpO2 06/20/18 1939 98 %     Weight --      Height --      Head Circumference --      Peak Flow --      Pain Score 06/20/18 1941 0     Pain Loc --      Pain Edu? --      Excl. in GC? --    No data found.  Updated Vital Signs BP 120/76   Pulse (!) 102   Temp 99 F (37.2 C) (Temporal)   Resp 18   SpO2 98%   Visual Acuity Right Eye Distance:   Left Eye Distance:   Bilateral Distance:    Right Eye Near:   Left Eye Near:    Bilateral Near:     Physical Exam  Constitutional: He appears well-developed. No distress.  HENT:  Right Ear: Tympanic membrane and ear canal normal.  Left Ear: Tympanic membrane and ear canal normal.  Mouth/Throat: Uvula is midline, oropharynx is clear and moist and mucous membranes are normal.  Cardiovascular: Normal rate, regular rhythm and normal heart sounds.  No murmur heard. Pulmonary/Chest: Effort normal and breath sounds normal. No stridor. No respiratory distress. He has no wheezes.  Abdominal: Soft. Bowel sounds are normal. He exhibits no distension and no mass. There is no tenderness.  Musculoskeletal: He exhibits no edema.  Neurological: He is alert. He displays no tremor and normal reflexes. He displays no seizure activity. Coordination and gait normal.  Nursing note and vitals reviewed.    UC Treatments / Results  Labs (all labs ordered are listed, but only abnormal results are displayed) Labs Reviewed - No data to  display  EKG None  Radiology No results found.  Procedures Procedures (including critical care time)  Medications Ordered in UC Medications - No data to display  Initial Impression / Assessment and Plan / UC Course  I have reviewed the triage vital signs and the nursing notes.  Pertinent labs & imaging results that were available during my care of the patient were reviewed by me and considered in my medical decision making (see chart for details).  Clinical Course  as of Jun 20 2042  Mon Jun 20, 2018  2025 Dizziness. May be due to dehydration vs medication induced. He is on Lamicatal, Propranolol and atarax whic can cause dizziness and gait imbalance. I recommended f/u with his PCP for medication review and adjustment. Will not give Meclizine for possible Vertigo, given all other medications he is on which can cause dizziness. Orthostatic vitals equivocal. Fall precaution recommended. ED precaution discussed. CT head warranted if worsening. F/U with neuro as scheduled.   [KE]  2026 Change in urine color. UA shows small leukocyte. I will send for culture. Keep well hydrated.   [KE]    Clinical Course User Index [KE] Doreene ElandEniola, Weslie Rasmus T, MD    Dizziness  Unsteady gait  Abnormal urine color   Final Clinical Impressions(s) / UC Diagnoses   Final diagnoses:  None   Discharge Instructions   None    ED Prescriptions    None     Controlled Substance Prescriptions [place] Controlled Substance Registry consulted? Not Applicable   Doreene ElandEniola, Landen Knoedler T, MD 06/20/18 2043

## 2018-06-20 NOTE — Telephone Encounter (Signed)
Spoke with pt's care giver, Judie GrieveBryan, who states that pt has been leaning to the left all day.  States that his balance is off.  I let him know that Dr. Karel JarvisAquino does not have anything available until October.  Judie GrieveBryan asked if our office has any "on call or PA's that could see him today" I let him know that that would be considered a "walk-in" and we do not have that.  Judie GrieveBryan states that he does not want pt "to fall out and die".  I advised that if he felt it was an emergent matter that he could always take pt to an Urgent Care facility.  Judie GrieveBryan states that he may just do that and being notes to appointment.  I advised that if he does bring pt to Urgent Care that [facility] as will have instant access to their notes via Epic.

## 2018-06-20 NOTE — Telephone Encounter (Signed)
Patient is leaning to the left side and dizzy and off balance wants to be seen today please call patient

## 2018-06-20 NOTE — Telephone Encounter (Signed)
Appointment made for 10/8/19j @ 2:00PM

## 2018-06-23 ENCOUNTER — Telehealth (HOSPITAL_COMMUNITY): Payer: Self-pay

## 2018-06-23 LAB — URINE CULTURE

## 2018-06-23 MED ORDER — NITROFURANTOIN MONOHYD MACRO 100 MG PO CAPS: 100.0000 mg | ORAL_CAPSULE | Freq: Two times a day (BID) | ORAL | 0 refills | Status: AC

## 2018-06-23 NOTE — Telephone Encounter (Signed)
Urine culture positive for Klebsiella Pneumoniae and Enterococcus Faecalis.  This was not treated at [facility] visit. Rx for Macrobid 100 mg BID x 5 days.

## 2018-07-05 ENCOUNTER — Ambulatory Visit: Payer: Medicaid Other | Admitting: Neurology

## 2018-11-22 IMAGING — DX DG CHEST 1V PORT
1 series · 1 of 1 positions shown · non-contrast
Comparison: Portable exam 3326 hours without priors for comparison

CLINICAL DATA: Shortness of breath

EXAM:
PORTABLE CHEST 1 VIEW

[chest ap]
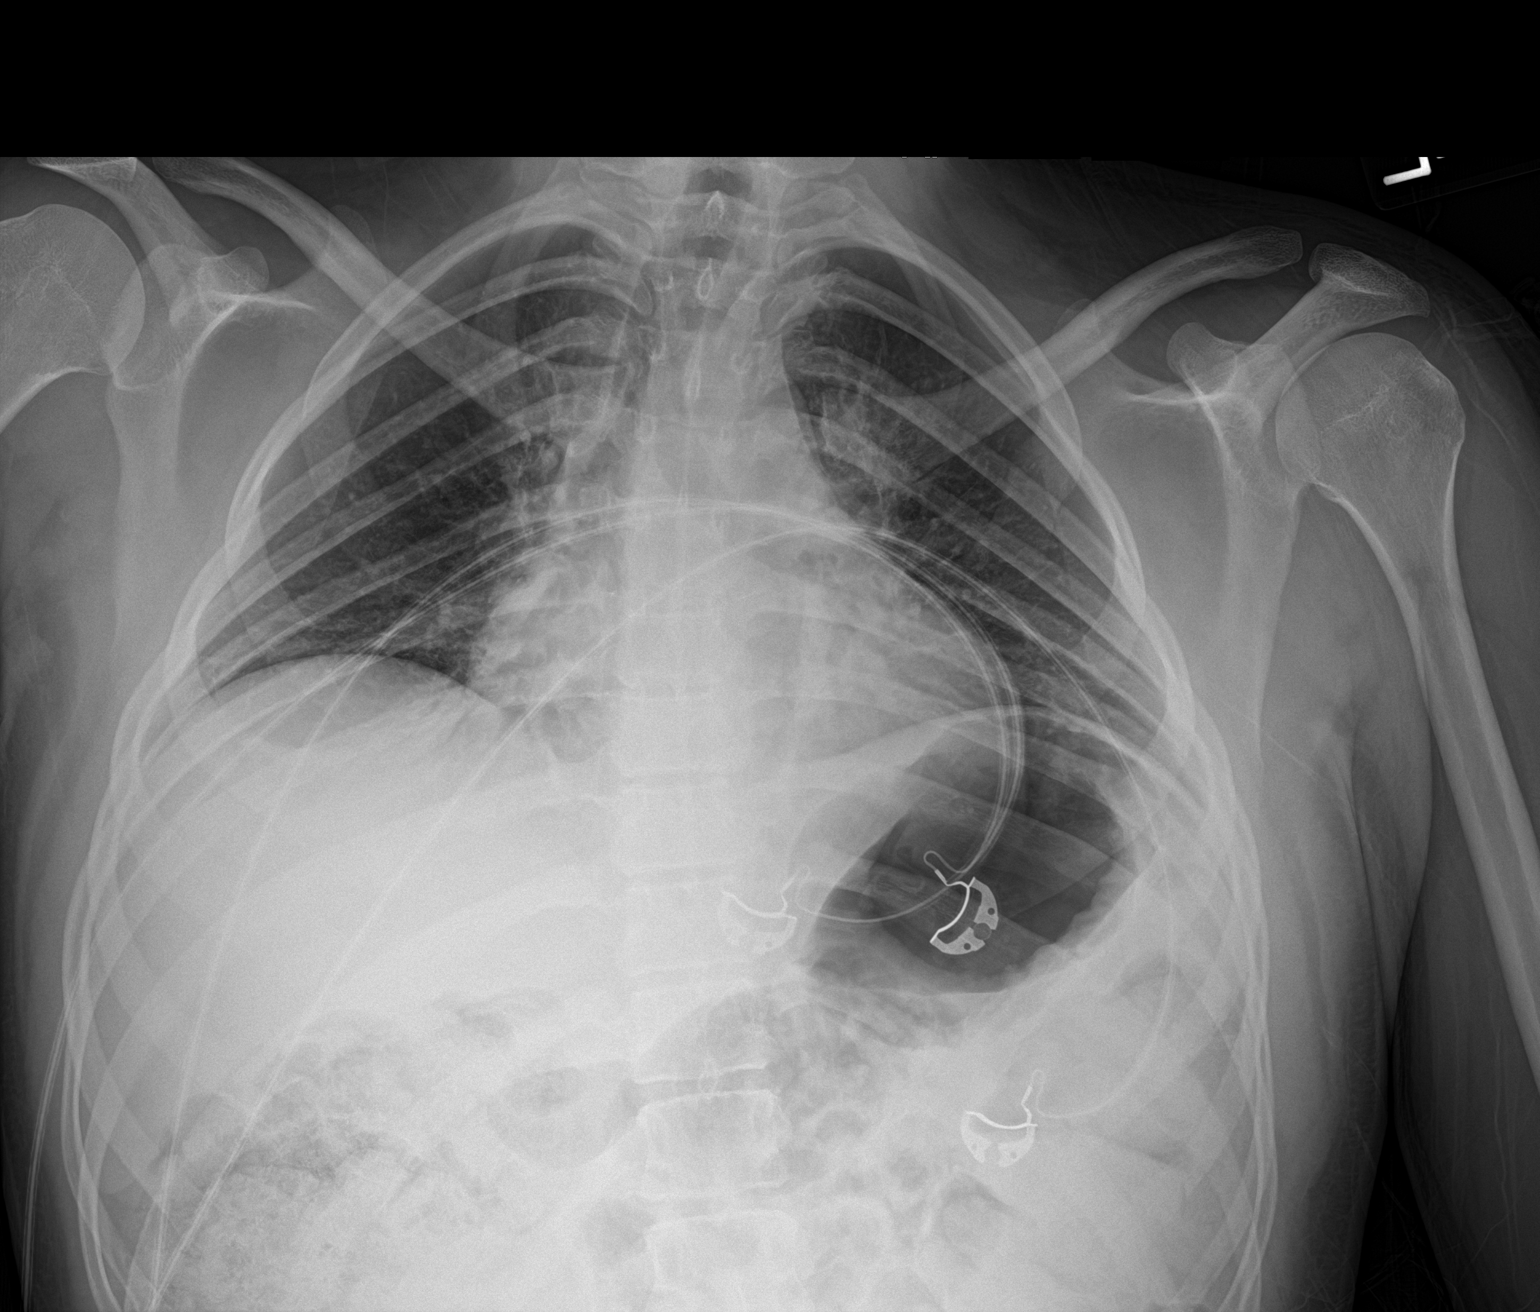

[1 of 1 positions shown; findings below may reference images not displayed]

FINDINGS: Low lung volumes with accentuation of heart size.

Mediastinal contours and pulmonary vascularity normal.

Bibasilar atelectasis.

No acute infiltrate, pleural effusion or pneumothorax.

Osseous structures unremarkable.
IMPRESSION: Low lung volumes with bibasilar atelectasis.

## 2018-12-15 ENCOUNTER — Ambulatory Visit: Payer: Medicaid Other | Admitting: Neurology

## 2019-05-08 ENCOUNTER — Other Ambulatory Visit: Payer: Self-pay | Admitting: Neurology

## 2019-05-24 ENCOUNTER — Other Ambulatory Visit: Payer: Self-pay | Admitting: Neurology
